# Patient Record
Sex: Male | Born: 1965 | Race: White | Hispanic: No | Marital: Married | Smoking: Never smoker
Health system: Southern US, Community
[De-identification: ages and names within clinical notes are randomized; demographics above are authoritative.]

---

## 2018-08-27 ENCOUNTER — Ambulatory Visit (INDEPENDENT_AMBULATORY_CARE_PROVIDER_SITE_OTHER): Payer: Self-pay

## 2018-08-27 ENCOUNTER — Other Ambulatory Visit: Payer: Self-pay | Admitting: Gerontology

## 2018-08-27 DIAGNOSIS — Z021 Encounter for pre-employment examination: Secondary | ICD-10-CM

## 2019-08-09 ENCOUNTER — Other Ambulatory Visit: Payer: Self-pay

## 2019-08-09 DIAGNOSIS — Z20822 Contact with and (suspected) exposure to covid-19: Secondary | ICD-10-CM

## 2019-08-11 LAB — NOVEL CORONAVIRUS, NAA: SARS-CoV-2, NAA: NOT DETECTED

## 2019-10-12 IMAGING — DX DG CHEST 2V
3 series · 3 of 3 positions shown · non-contrast
Comparison: None.

CLINICAL DATA: Pre-employment physical

EXAM:
CHEST - 2 VIEW

[chest pa (1 of 2)]
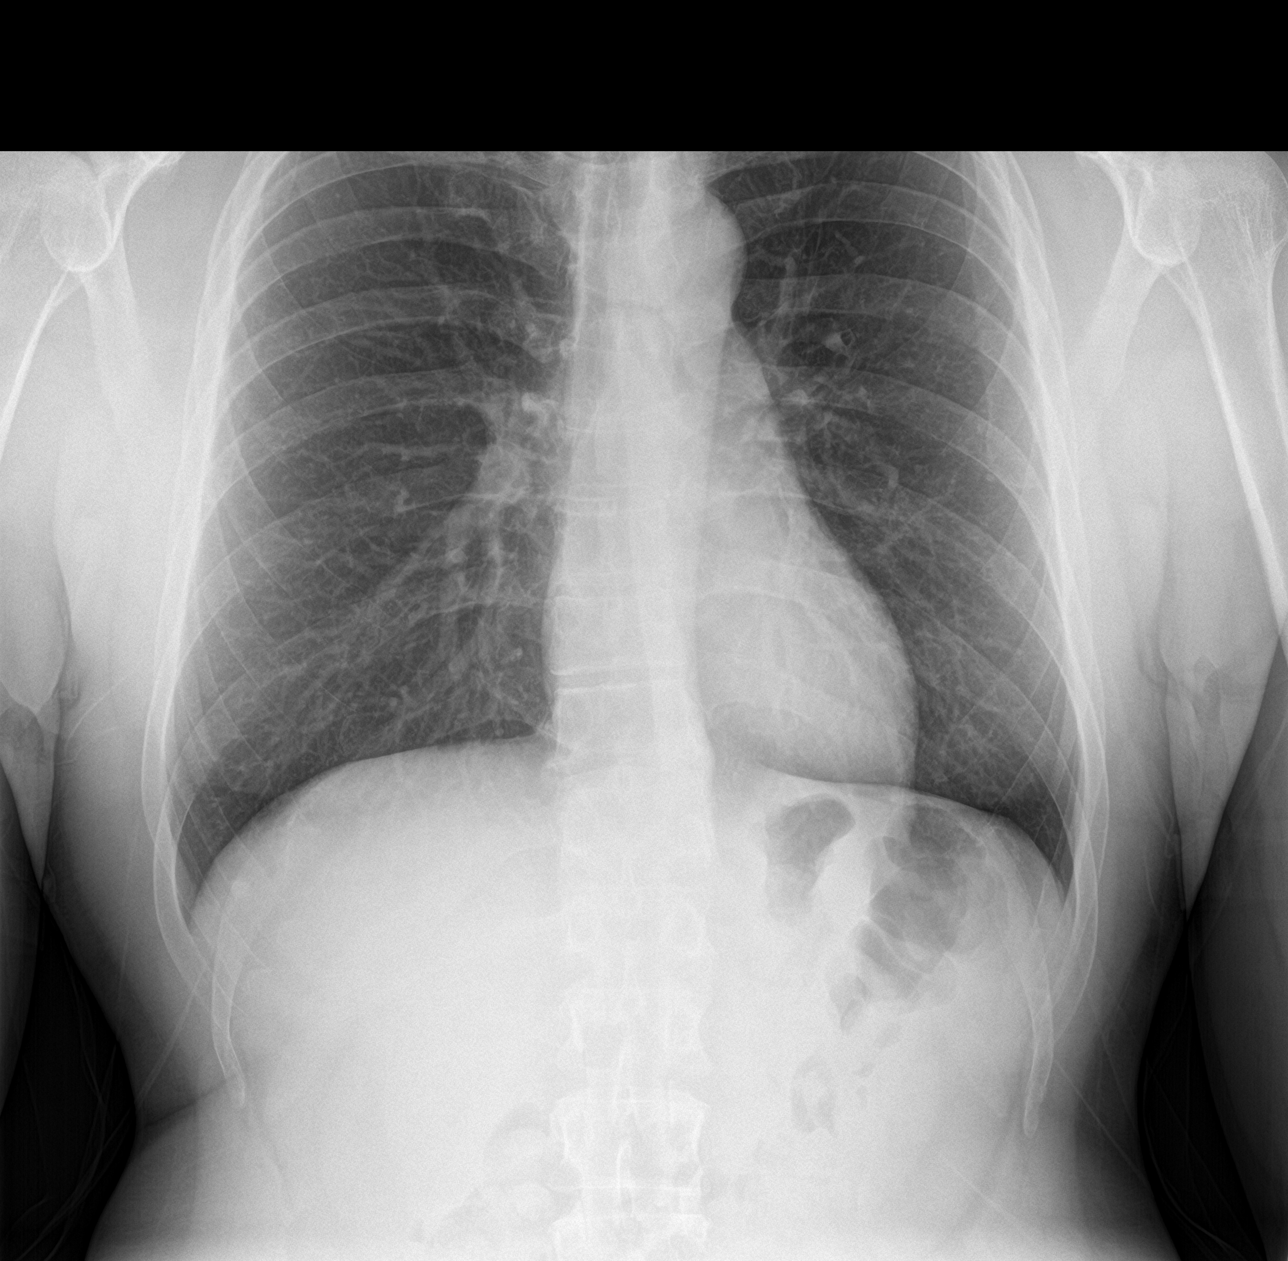

[chest lat]
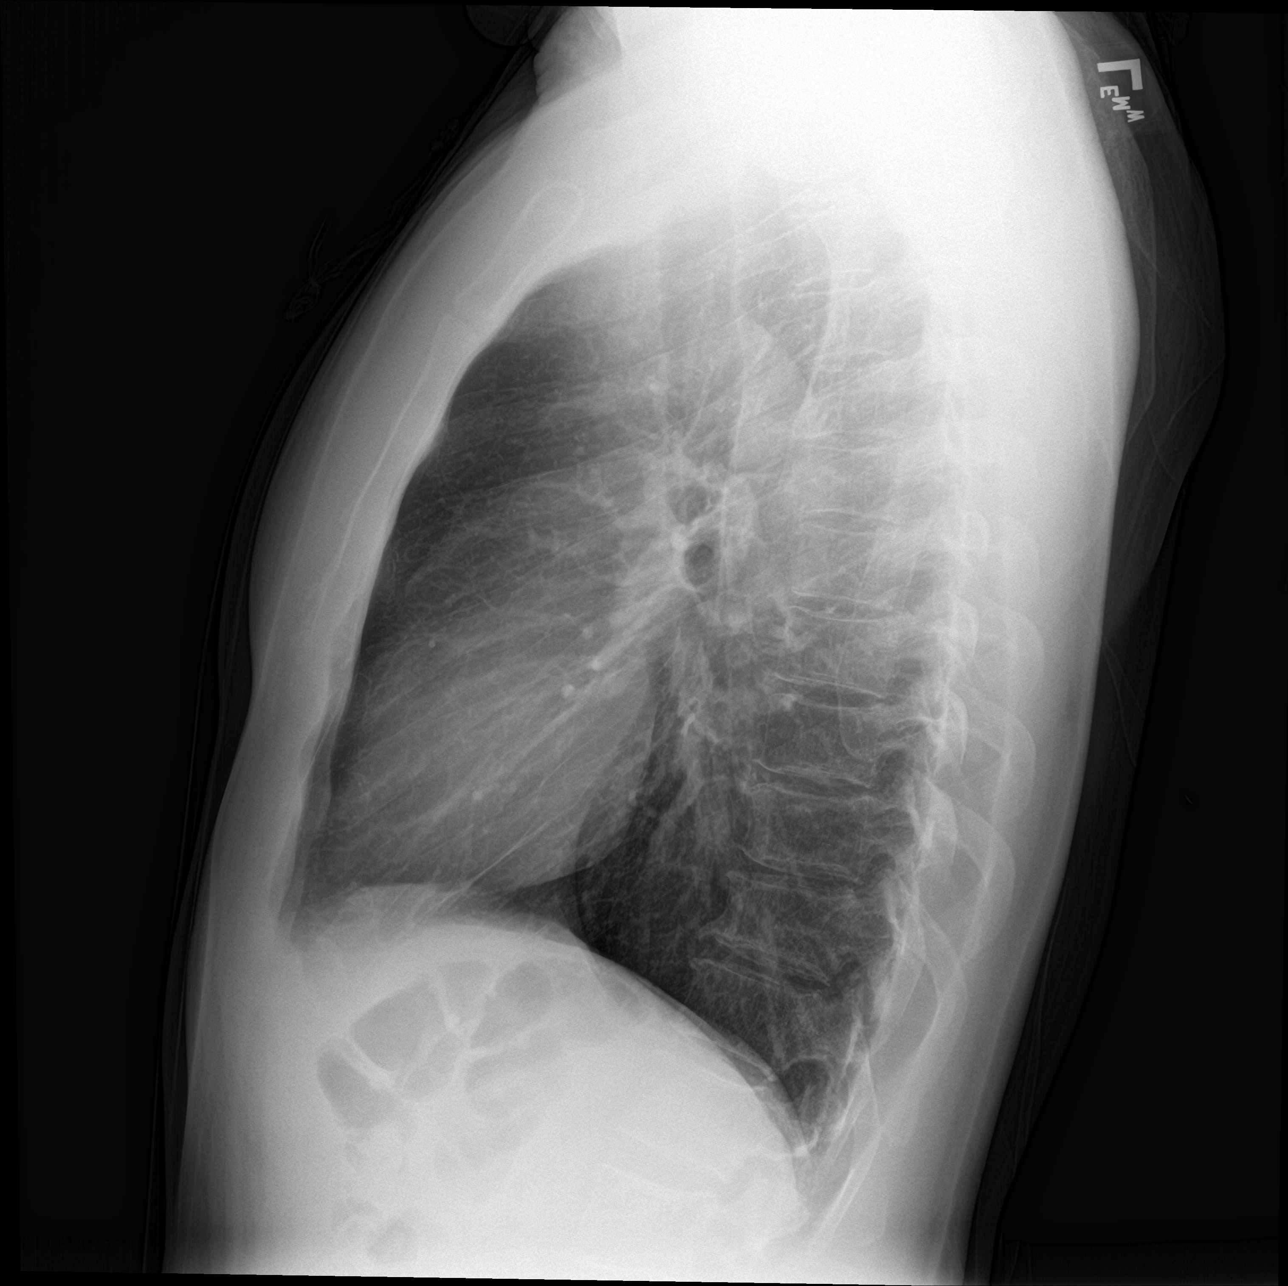

[chest pa (2 of 2)]
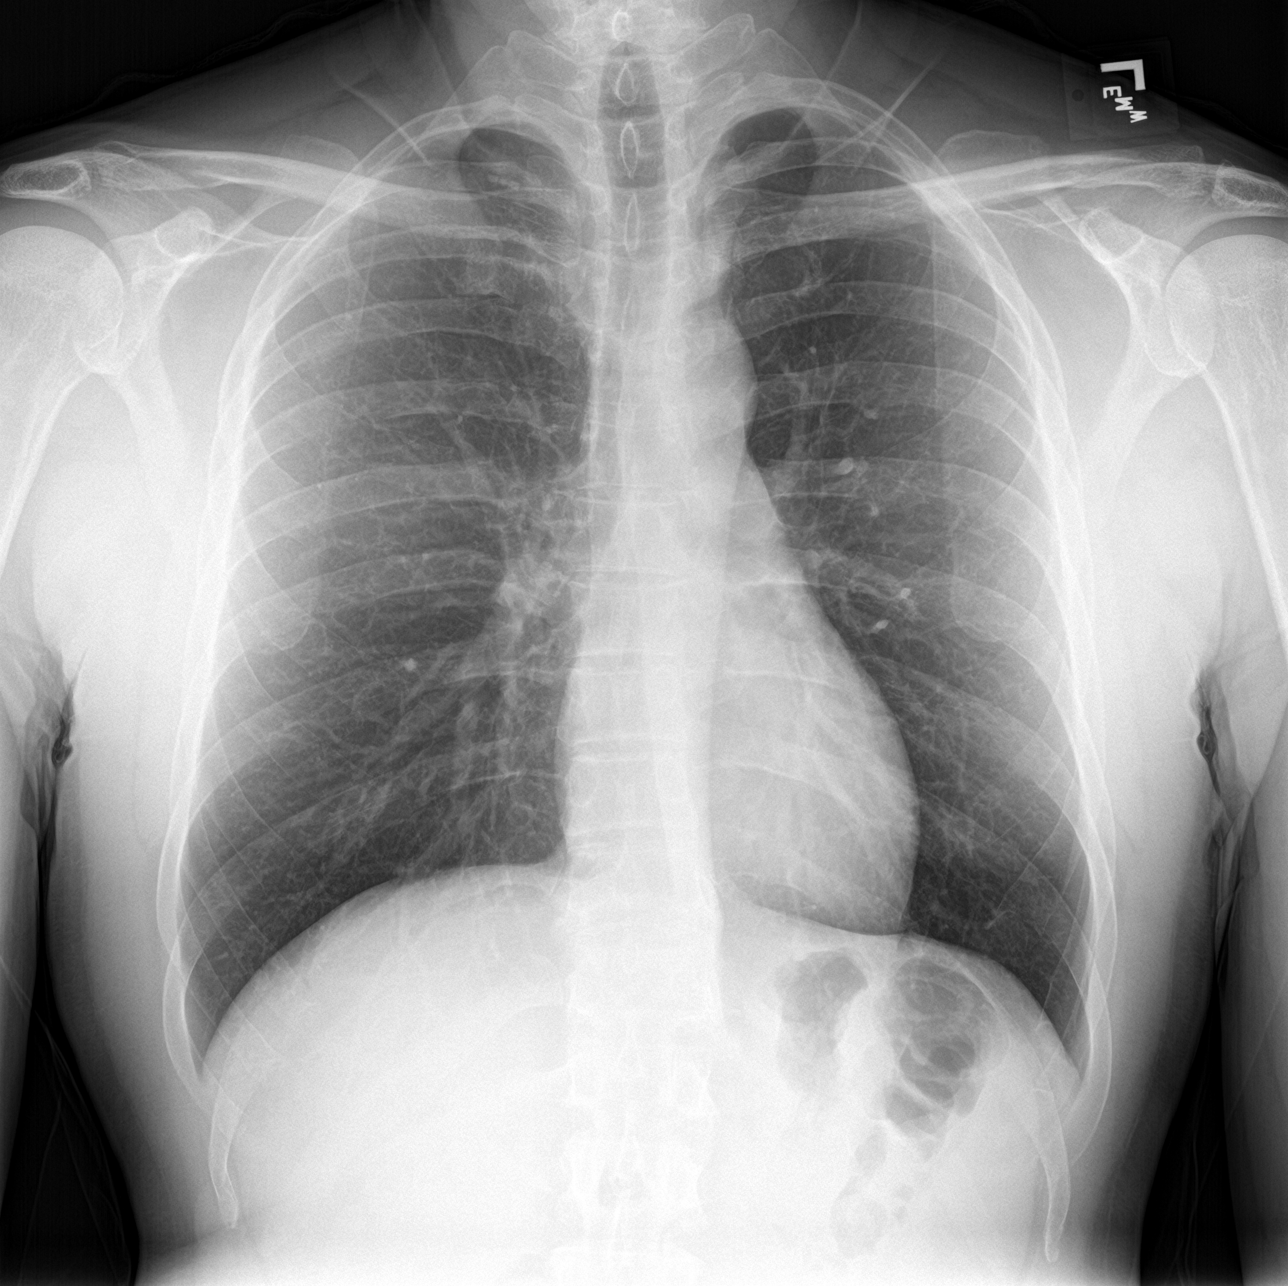

[3 of 3 positions shown; findings below may reference images not displayed]

FINDINGS: The heart size and mediastinal contours are within normal limits.
Both lungs are clear. The visualized skeletal structures are
unremarkable.
IMPRESSION: No active cardiopulmonary disease.

## 2021-05-10 ENCOUNTER — Emergency Department
Admission: RE | Admit: 2021-05-10 | Discharge: 2021-05-10 | Discharge status: Against Medical Advice | Payer: Managed Care, Other (non HMO) | Source: Ambulatory Visit | Attending: Family Medicine | Admitting: Family Medicine

## 2021-05-10 ENCOUNTER — Other Ambulatory Visit: Payer: Self-pay

## 2021-05-10 ENCOUNTER — Emergency Department (INDEPENDENT_AMBULATORY_CARE_PROVIDER_SITE_OTHER): Payer: Managed Care, Other (non HMO)

## 2021-05-10 VITALS — BP 148/89 | HR 50 | Temp 97.7°F | Resp 18 | Ht 70.0 in | Wt 180.0 lb

## 2021-05-10 DIAGNOSIS — S20212A Contusion of left front wall of thorax, initial encounter: Secondary | ICD-10-CM | POA: Diagnosis not present

## 2021-05-10 DIAGNOSIS — W19XXXA Unspecified fall, initial encounter: Secondary | ICD-10-CM

## 2021-05-10 DIAGNOSIS — S20222A Contusion of left back wall of thorax, initial encounter: Secondary | ICD-10-CM

## 2021-05-10 LAB — POCT URINALYSIS DIP (MANUAL ENTRY)
Bilirubin, UA: NEGATIVE
Blood, UA: NEGATIVE
Glucose, UA: NEGATIVE mg/dL
Ketones, POC UA: NEGATIVE mg/dL
Nitrite, UA: NEGATIVE
Protein Ur, POC: NEGATIVE mg/dL
Spec Grav, UA: 1.025 (ref 1.010–1.025)
Urobilinogen, UA: 0.2 E.U./dL
pH, UA: 5.5 (ref 5.0–8.0)

## 2021-05-10 NOTE — ED Provider Notes (Signed)
[facility]    CSN: [id number] Arrival date & time: 05/10/21  1532      History   Chief Complaint Chief Complaint  Patient presents with   Appt-Fall    HPI Austin Castro is a 55 y.o. male.   While walking down stairs three days ago, patient slipped on the last step.  He fell, striking his left lower back on the edge of the last stair tread.  He complains of intermittent sharp pain in his left lower posterior chest/back when twisting or moving his upper body.  The pain also occurs briefly with deep inspiration.  He denies shortness of breath, abdominal pain and hematuria.  The history is provided by the patient.   History reviewed. No pertinent past medical history.  There are no problems to display for this patient.   History reviewed. No pertinent surgical history.     Home Medications    Prior to Admission medications   Not on File    Family History Family History  Problem Relation Age of Onset   Dementia Mother     Social History Social History   Tobacco Use   Smoking status: Never   Smokeless tobacco: Never  Vaping Use   Vaping Use: Never used  Substance Use Topics   Alcohol use: Not Currently   Drug use: Not Currently     Allergies   Patient has no known allergies.   Review of Systems Review of Systems  Constitutional:  Negative for activity change, appetite change, chills, diaphoresis, fatigue and fever.  HENT: Negative.    Eyes: Negative.   Respiratory:  Negative for cough, chest tightness, shortness of breath, wheezing and stridor.   Cardiovascular: Negative.   Gastrointestinal: Negative.   Genitourinary:  Negative for difficulty urinating, flank pain and hematuria.  Musculoskeletal:  Positive for back pain.  Skin:  Positive for color change.  Neurological:  Negative for dizziness.    Physical Exam Triage Vital Signs ED Triage Vitals  Enc Vitals Group     BP 05/10/21 1615 (!) 148/89     Pulse Rate 05/10/21 1615 (!)  50     Resp 05/10/21 1615 18     Temp 05/10/21 1615 97.7 F (36.5 C)     Temp Source 05/10/21 1615 Oral     SpO2 05/10/21 1615 99 %     Weight 05/10/21 1617 180 lb (81.6 kg)     Height 05/10/21 1617 5\' 10"  (1.778 m)     Head Circumference --      Peak Flow --      Pain Score 05/10/21 1616 1     Pain Loc --      Pain Edu? --      Excl. in GC? --    No data found.  Updated Vital Signs BP (!) 148/89 (BP Location: Right Arm)   Pulse (!) 50   Temp 97.7 F (36.5 C) (Oral)   Resp 18   Ht 5\' 10"  (1.778 m)   Wt 81.6 kg   SpO2 99%   BMI 25.83 kg/m   Visual Acuity Right Eye Distance:   Left Eye Distance:   Bilateral Distance:    Right Eye Near:   Left Eye Near:    Bilateral Near:     Physical Exam Vitals and nursing note reviewed.  Constitutional:      General: He is not in acute distress.    Appearance: He is not ill-appearing.  HENT:     Head:  Atraumatic.     Right Ear: External ear normal.     Left Ear: External ear normal.     Mouth/Throat:     Mouth: Mucous membranes are moist.  Eyes:     Pupils: Pupils are equal, round, and reactive to light.  Cardiovascular:     Rate and Rhythm: Regular rhythm. Bradycardia present.     Heart sounds: Normal heart sounds.  Pulmonary:     Breath sounds: Normal breath sounds.       Comments: Left inferior chest near costal margin has mild swelling, tenderness, and resolving ecchymosis as noted on diagram.   Abdominal:     Tenderness: There is no abdominal tenderness.  Musculoskeletal:        General: No swelling or tenderness.     Cervical back: Normal range of motion.  Skin:    General: Skin is warm and dry.     Findings: Bruising present.  Neurological:     Mental Status: He is alert and oriented to person, place, and time.     UC Treatments / Results  Labs (all labs ordered are listed, but only abnormal results are displayed) Labs Reviewed  POCT URINALYSIS DIP (MANUAL ENTRY) - Abnormal; Notable for the following  components:      Result Value   Leukocytes, UA Trace (*)    All other components within normal limits    EKG   Radiology DG Ribs Unilateral W/Chest Left  Result Date: 05/10/2021 CLINICAL DATA:  Status post fall. EXAM: LEFT RIBS AND CHEST - 3+ VIEW COMPARISON:  None FINDINGS: No fracture or other bone lesions are seen involving the ribs. There is no evidence of pneumothorax or pleural effusion. Both lungs are clear. Heart size and mediastinal contours are within normal limits. IMPRESSION: Negative. Electronically Signed   By: Signa Kell M.D.   On: 05/10/2021 18:02    Procedures Procedures (including critical care time)  Medications Ordered in UC Medications - No data to display  Initial Impression / Assessment and Plan / UC Course  I have reviewed the triage vital signs and the nursing notes.  Pertinent labs & imaging results that were available during my care of the patient were reviewed by me and considered in my medical decision making (see chart for details).    No evidence rib fracture.  No evidence kidney contusion. Followup with Family Doctor if not improved in about two weeks.  Final Clinical Impressions(s) / UC Diagnoses   Final diagnoses:  Contusion, chest wall, left, initial encounter     Discharge Instructions      Apply ice pack for 20 to 30 minutes, 3 to 4 times daily  Continue until pain and swelling decrease.  Take Ibuprofen 200mg , 4 tabs every 8 hours with food.      ED Prescriptions   None       , MD 05/12/21 [phone]

## 2021-05-10 NOTE — ED Triage Notes (Addendum)
Fell on stairs injury to Lower Left back x 3 days ago.Bruised and painful.

## 2021-05-10 NOTE — Discharge Instructions (Addendum)
Apply ice pack for 20 to 30 minutes, 3 to 4 times daily  Continue until pain and swelling decrease.  Take Ibuprofen 200mg , 4 tabs every 8 hours with food.

## 2022-06-25 IMAGING — DX DG RIBS W/ CHEST 3+V*L*
3 series · 3 of 3 positions shown · non-contrast
Comparison: None

CLINICAL DATA: Status post fall.

EXAM:
LEFT RIBS AND CHEST - 3+ VIEW

[chest pa]
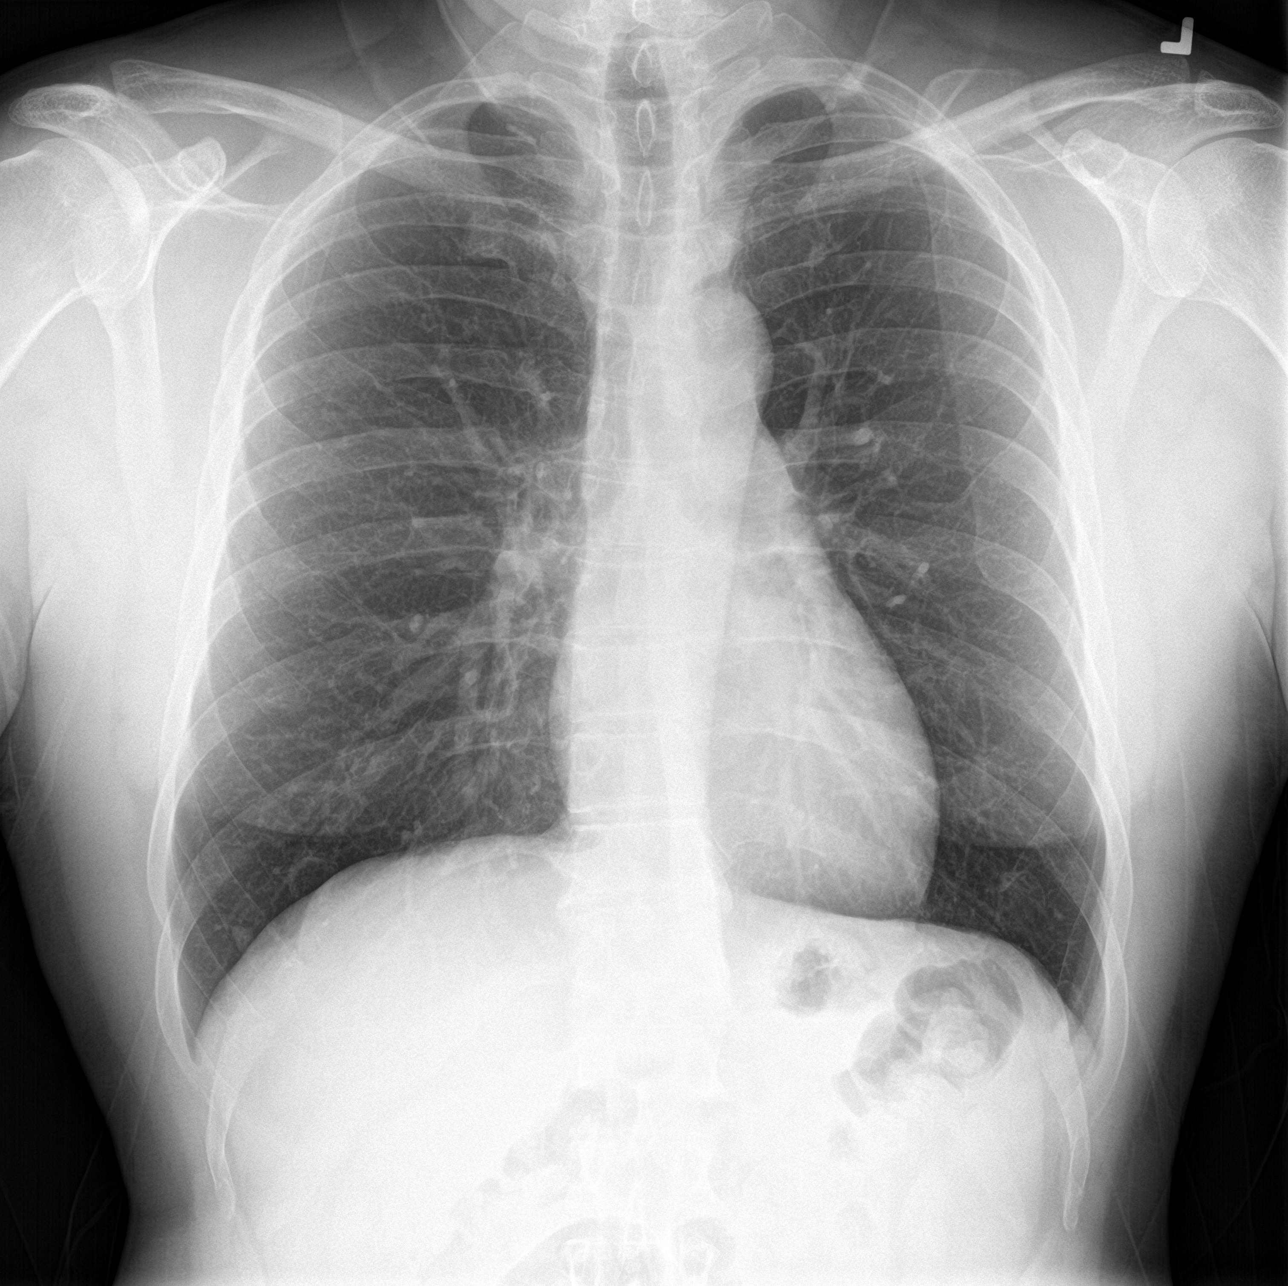

[rib ap]
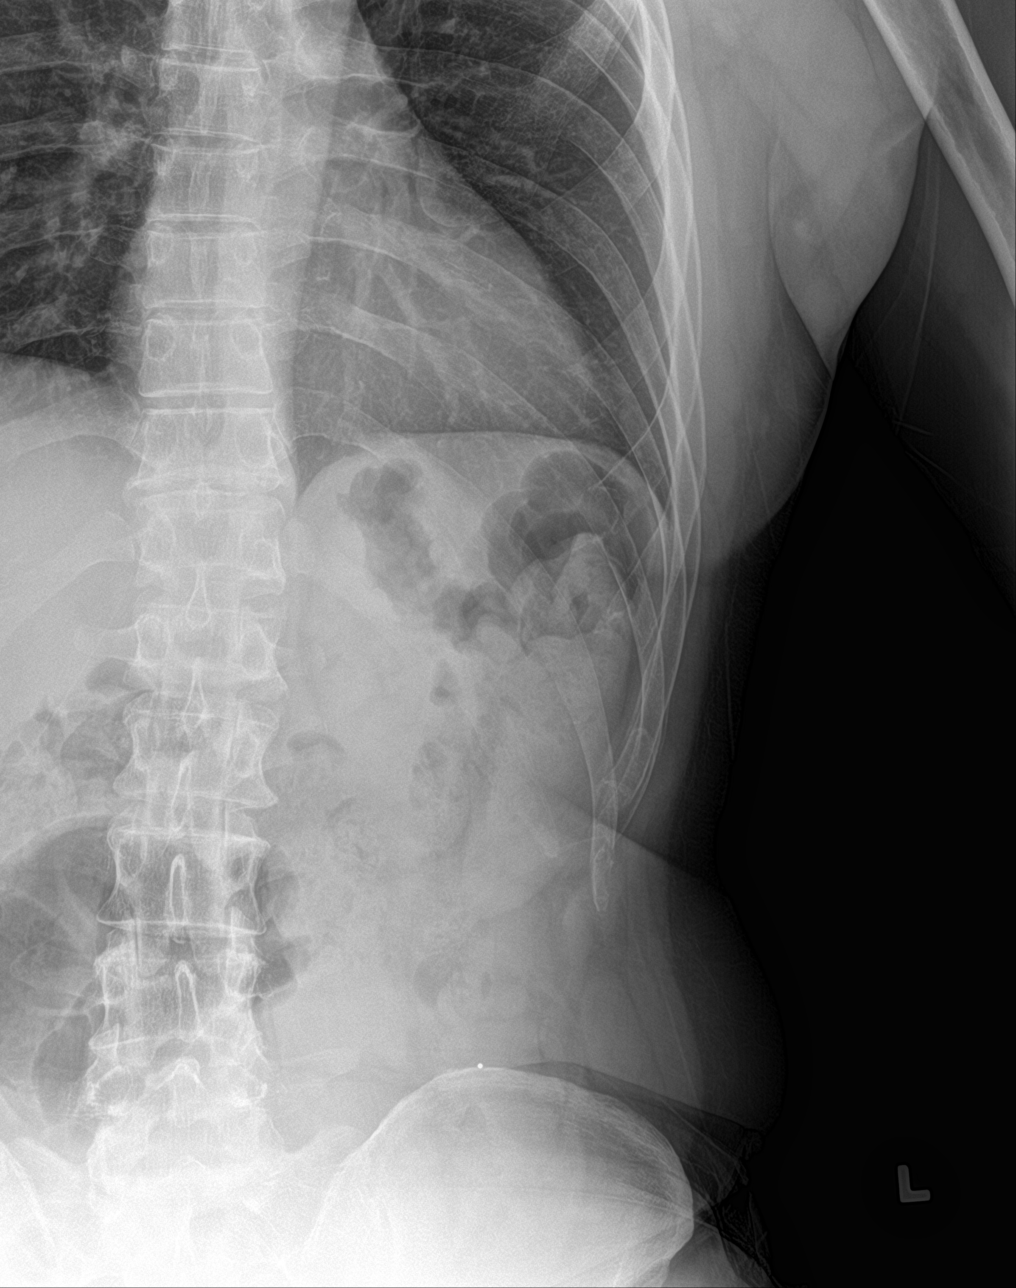

[rib ap obl]
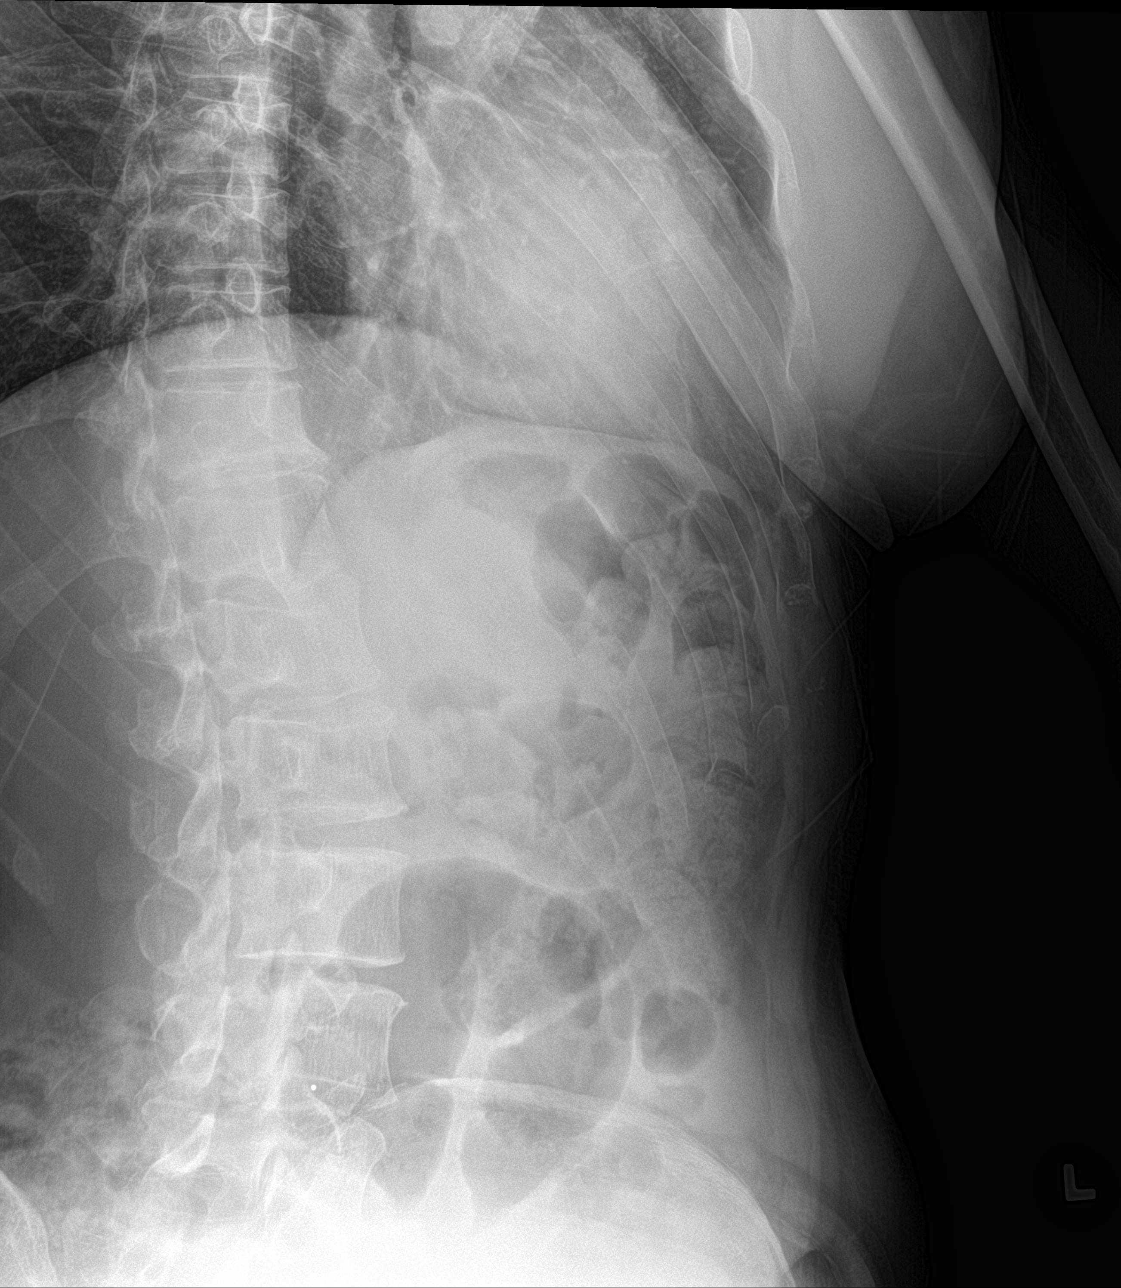

[3 of 3 positions shown; findings below may reference images not displayed]

FINDINGS: No fracture or other bone lesions are seen involving the ribs. There
is no evidence of pneumothorax or pleural effusion. Both lungs are
clear. Heart size and mediastinal contours are within normal limits.
IMPRESSION: Negative.

## 2023-05-05 ENCOUNTER — Other Ambulatory Visit: Payer: Self-pay

## 2023-05-05 DIAGNOSIS — R972 Elevated prostate specific antigen [PSA]: Secondary | ICD-10-CM

## 2023-05-07 ENCOUNTER — Other Ambulatory Visit: Payer: Self-pay | Admitting: Urology

## 2023-05-07 DIAGNOSIS — R972 Elevated prostate specific antigen [PSA]: Secondary | ICD-10-CM

## 2023-06-14 ENCOUNTER — Ambulatory Visit
Admission: RE | Admit: 2023-06-14 | Discharge: 2023-06-14 | Disposition: A | Discharge status: Home / Self Care | Payer: BC Managed Care – PPO | Source: Ambulatory Visit | Attending: Urology | Admitting: Urology

## 2023-06-14 DIAGNOSIS — R972 Elevated prostate specific antigen [PSA]: Secondary | ICD-10-CM

## 2023-06-14 MED ORDER — GADOPICLENOL 0.5 MMOL/ML IV SOLN
8.0000 mL | Freq: Once | INTRAVENOUS | Status: AC | PRN
  Administered 2023-06-14: 8 mL via INTRAVENOUS

## 2024-10-07 DIAGNOSIS — R972 Elevated prostate specific antigen [PSA]: Secondary | ICD-10-CM | POA: Diagnosis not present

## 2024-10-07 DIAGNOSIS — Z0000 Encounter for general adult medical examination without abnormal findings: Secondary | ICD-10-CM | POA: Diagnosis not present

## 2024-10-07 DIAGNOSIS — Z23 Encounter for immunization: Secondary | ICD-10-CM | POA: Diagnosis not present

## 2024-10-08 DIAGNOSIS — Z85828 Personal history of other malignant neoplasm of skin: Secondary | ICD-10-CM | POA: Diagnosis not present

## 2024-10-08 DIAGNOSIS — D2221 Melanocytic nevi of right ear and external auricular canal: Secondary | ICD-10-CM | POA: Diagnosis not present

## 2024-10-08 DIAGNOSIS — D2261 Melanocytic nevi of right upper limb, including shoulder: Secondary | ICD-10-CM | POA: Diagnosis not present

## 2024-10-08 DIAGNOSIS — D225 Melanocytic nevi of trunk: Secondary | ICD-10-CM | POA: Diagnosis not present

## 2024-10-08 DIAGNOSIS — L570 Actinic keratosis: Secondary | ICD-10-CM | POA: Diagnosis not present

## 2024-10-08 DIAGNOSIS — D2262 Melanocytic nevi of left upper limb, including shoulder: Secondary | ICD-10-CM | POA: Diagnosis not present

## 2024-10-08 DIAGNOSIS — L2089 Other atopic dermatitis: Secondary | ICD-10-CM | POA: Diagnosis not present

## 2024-10-08 DIAGNOSIS — L814 Other melanin hyperpigmentation: Secondary | ICD-10-CM | POA: Diagnosis not present

## 2024-10-08 DIAGNOSIS — L308 Other specified dermatitis: Secondary | ICD-10-CM | POA: Diagnosis not present

## 2024-10-08 DIAGNOSIS — L821 Other seborrheic keratosis: Secondary | ICD-10-CM | POA: Diagnosis not present

## 2024-10-12 DIAGNOSIS — R030 Elevated blood-pressure reading, without diagnosis of hypertension: Secondary | ICD-10-CM | POA: Diagnosis not present

## 2024-10-12 DIAGNOSIS — Z136 Encounter for screening for cardiovascular disorders: Secondary | ICD-10-CM | POA: Diagnosis not present

## 2024-10-12 DIAGNOSIS — R972 Elevated prostate specific antigen [PSA]: Secondary | ICD-10-CM | POA: Diagnosis not present

## 2024-10-12 DIAGNOSIS — Z0000 Encounter for general adult medical examination without abnormal findings: Secondary | ICD-10-CM | POA: Diagnosis not present
